# Patient Record
Sex: Male | Born: 1975 | Race: White | Hispanic: No | Marital: Married | Smoking: Former smoker
Health system: Southern US, Community
[De-identification: ages and names within clinical notes are randomized; demographics above are authoritative.]

## PROBLEM LIST (undated history)

## (undated) HISTORY — PX: BACK SURGERY: SHX140

## (undated) HISTORY — PX: APPENDECTOMY: SHX54

---

## 2016-12-26 DIAGNOSIS — K219 Gastro-esophageal reflux disease without esophagitis: Secondary | ICD-10-CM | POA: Diagnosis not present

## 2016-12-26 DIAGNOSIS — M25511 Pain in right shoulder: Secondary | ICD-10-CM | POA: Diagnosis not present

## 2016-12-26 MED FILL — raNITIdine HCL 300 MG TABS: 300 | 90 days supply | Qty: 90 | Fill #0

## 2016-12-26 MED FILL — VALACYCLOVIR HCL 500 MG TAB: 500 | 10 days supply | Qty: 20 | Fill #0

## 2017-03-22 MED FILL — VALACYCLOVIR HCL 500 MG TAB: 500 | 10 days supply | Qty: 20 | Fill #1

## 2017-07-04 MED FILL — hydrOXYzine PAMOATE 50 MG C: 50 | 10 days supply | Qty: 30 | Fill #0

## 2017-08-31 DIAGNOSIS — Z0000 Encounter for general adult medical examination without abnormal findings: Secondary | ICD-10-CM | POA: Diagnosis not present

## 2017-08-31 MED FILL — MONTELUKAST SOD 10 MG TAB: 10 | 30 days supply | Qty: 30 | Fill #0

## 2017-09-15 MED FILL — HYDROXYZINE PAM 50 MG CAP: 50 | 10 days supply | Qty: 30 | Fill #1

## 2017-09-22 DIAGNOSIS — K625 Hemorrhage of anus and rectum: Secondary | ICD-10-CM | POA: Diagnosis not present

## 2017-11-14 DIAGNOSIS — D122 Benign neoplasm of ascending colon: Secondary | ICD-10-CM | POA: Diagnosis not present

## 2017-11-14 DIAGNOSIS — K625 Hemorrhage of anus and rectum: Secondary | ICD-10-CM | POA: Diagnosis not present

## 2018-09-01 ENCOUNTER — Encounter (HOSPITAL_COMMUNITY): Payer: Self-pay

## 2018-09-01 ENCOUNTER — Ambulatory Visit (INDEPENDENT_AMBULATORY_CARE_PROVIDER_SITE_OTHER): Payer: 59

## 2018-09-01 ENCOUNTER — Ambulatory Visit (HOSPITAL_COMMUNITY)
Admission: EM | Admit: 2018-09-01 | Discharge: 2018-09-01 | Disposition: A | Discharge status: Home / Self Care | Payer: 59 | Attending: Emergency Medicine | Admitting: Emergency Medicine

## 2018-09-01 ENCOUNTER — Other Ambulatory Visit: Payer: Self-pay

## 2018-09-01 DIAGNOSIS — M79671 Pain in right foot: Secondary | ICD-10-CM | POA: Diagnosis not present

## 2018-09-01 DIAGNOSIS — S99921A Unspecified injury of right foot, initial encounter: Secondary | ICD-10-CM

## 2018-09-01 NOTE — ED Provider Notes (Signed)
[place]    CSN: [id number] Arrival date & time: 09/01/18  1111     History   Chief Complaint Chief Complaint  Patient presents with  . Foot Pain    HPI Raymond Blanchard is a 43 y.o. male.   Raymond Blanchard presents with complaints of dorsal aspect of right foot pain after a twist and fall injury 1 week ago. He was hunting and stepped wrong, his foot rolled, popped, and he fell down. Originally with ankle pain which has improved. Still with foot pain. It is sharp. Worse with weight bearing but does experience throbbing even while at rest. Wearing shoes feels better than without while ambulating. No numbness or tingling. No bruising or redness. Minimal swelling. Tried ibuprofen which minimally helped with pain. Denies any previous foot or ankle injury. Without contributing medical history.      ROS per HPI, negative if not otherwise mentioned.      History reviewed. No pertinent past medical history.  There are no active problems to display for this patient.   Past Surgical History:  Procedure Laterality Date  . APPENDECTOMY    . BACK SURGERY         Home Medications    Prior to Admission medications   Not on File    Family History Family History  Family history unknown: Yes    Social History Social History   Tobacco Use  . Smoking status: Former Smoker  Substance Use Topics  . Alcohol use: Not on file  . Drug use: Not on file     Allergies   Patient has no known allergies.   Review of Systems Review of Systems   Physical Exam Triage Vital Signs ED Triage Vitals [09/01/18 1126]  Enc Vitals Group     BP 119/86     Pulse Rate 88     Resp 18     Temp 98.3 F (36.8 C)     Temp Source Oral     SpO2 97 %     Weight      Height      Head Circumference      Peak Flow      Pain Score 7     Pain Loc      Pain Edu?      Excl. in [place]?    No data found.  Updated Vital Signs BP 119/86 (BP Location: Right Arm)   Pulse 88    Temp 98.3 F (36.8 C) (Oral)   Resp 18   SpO2 97%   Visual Acuity Right Eye Distance:   Left Eye Distance:   Bilateral Distance:    Right Eye Near:   Left Eye Near:    Bilateral Near:     Physical Exam Constitutional:      Appearance: He is well-developed.  Cardiovascular:     Rate and Rhythm: Normal rate and regular rhythm.  Pulmonary:     Effort: Pulmonary effort is normal.     Breath sounds: Normal breath sounds.  Musculoskeletal:     Right ankle: Normal.     Right foot: Normal capillary refill. Tenderness and bony tenderness present. No swelling, crepitus, deformity or laceration.       Feet:     Comments: Point tenderness to right dorsal foot, at approximately the proximal aspect of 4th metatarsal; no bruising or swelling; no redness; strong pedal pulse; cap refill < 2 seconds; gross sensation intact; ankle WNL  Skin:    General:  Skin is warm and dry.  Neurological:     Mental Status: He is alert and oriented to person, place, and time.      UC Treatments / Results  Labs (all labs ordered are listed, but only abnormal results are displayed) Labs Reviewed - No data to display  EKG None  Radiology Dg Foot Complete Right  Result Date: 09/01/2018 CLINICAL DATA:  Fall with twisting injury to the right foot 1 week prior with persistent pain EXAM: RIGHT FOOT COMPLETE - 3+ VIEW COMPARISON:  None. FINDINGS: There is no evidence of fracture or dislocation. There is no evidence of arthropathy or other focal bone abnormality. Soft tissues are unremarkable. IMPRESSION: No fracture or dislocation in the right foot. Electronically Signed   By: Ilona Sorrel M.D.   On: 09/01/2018 12:20    Procedures Procedures (including critical care time)  Medications Ordered in UC Medications - No data to display  Initial Impression / Assessment and Plan / UC Course  I have reviewed the triage vital signs and the nursing notes.  Pertinent labs & imaging results that were available  during my care of the patient were reviewed by me and considered in my medical decision making (see chart for details).     No redness, swelling or bruising. Xray without acute findings. Supportive cares provided, likely strain to foot. Solid soled shoe recommended. Follow up with ortho as needed. Patient verbalized understanding and agreeable to plan.  Ambulatory out of clinic without difficulty.   Final Clinical Impressions(s) / UC Diagnoses   Final diagnoses:  Injury of right foot, initial encounter     Discharge Instructions     EXAM: RIGHT FOOT COMPLETE - 3+ VIEW   COMPARISON:  None.   FINDINGS: There is no evidence of fracture or dislocation. There is no evidence of arthropathy or other focal bone abnormality. Soft tissues are unremarkable.   IMPRESSION: No fracture or dislocation in the right foot.     Electronically Signed   By: Ilona Sorrel M.D.   On: 09/01/2018 12:20   Ice, elevation, use of antiinflammatories as needed for pain.  Activity as tolerated, solid soled shoes for support, may even be helpful while at home.  Follow up with podiatry and/or orthopedics if no improvement in the next 3-4 weeks.     ED Prescriptions    None     Controlled Substance Prescriptions [place] Controlled Substance Registry consulted? Not Applicable   Zigmund Gottron, NP 09/01/18 1238

## 2018-09-01 NOTE — ED Triage Notes (Signed)
Pt presents with right foot injury from rolling foot while hunting a week ago.

## 2018-09-01 NOTE — Discharge Instructions (Signed)
EXAM: RIGHT FOOT COMPLETE - 3+ VIEW   COMPARISON:  None.   FINDINGS: There is no evidence of fracture or dislocation. There is no evidence of arthropathy or other focal bone abnormality. Soft tissues are unremarkable.   IMPRESSION: No fracture or dislocation in the right foot.     Electronically Signed   By: Ilona Sorrel M.D.   On: 09/01/2018 12:20   Ice, elevation, use of antiinflammatories as needed for pain.  Activity as tolerated, solid soled shoes for support, may even be helpful while at home.  Follow up with podiatry and/or orthopedics if no improvement in the next 3-4 weeks.

## 2020-04-06 DIAGNOSIS — Z136 Encounter for screening for cardiovascular disorders: Secondary | ICD-10-CM | POA: Diagnosis not present

## 2020-04-06 DIAGNOSIS — Z23 Encounter for immunization: Secondary | ICD-10-CM | POA: Diagnosis not present

## 2020-04-06 DIAGNOSIS — I493 Ventricular premature depolarization: Secondary | ICD-10-CM | POA: Diagnosis not present

## 2020-04-06 DIAGNOSIS — Z0000 Encounter for general adult medical examination without abnormal findings: Secondary | ICD-10-CM | POA: Diagnosis not present

## 2020-04-06 DIAGNOSIS — B351 Tinea unguium: Secondary | ICD-10-CM | POA: Diagnosis not present

## 2020-04-06 DIAGNOSIS — B009 Herpesviral infection, unspecified: Secondary | ICD-10-CM | POA: Diagnosis not present

## 2020-04-06 DIAGNOSIS — F5104 Psychophysiologic insomnia: Secondary | ICD-10-CM | POA: Diagnosis not present

## 2020-04-07 DIAGNOSIS — I493 Ventricular premature depolarization: Secondary | ICD-10-CM | POA: Diagnosis not present

## 2020-04-28 DIAGNOSIS — I493 Ventricular premature depolarization: Secondary | ICD-10-CM | POA: Diagnosis not present

## 2020-05-06 ENCOUNTER — Other Ambulatory Visit: Payer: Self-pay

## 2020-05-06 ENCOUNTER — Ambulatory Visit: Payer: 59 | Admitting: Cardiology

## 2020-05-06 ENCOUNTER — Encounter: Payer: Self-pay | Admitting: Cardiology

## 2020-05-06 VITALS — BP 132/79 | HR 94 | Resp 16 | Ht 75.0 in | Wt 226.4 lb

## 2020-05-06 DIAGNOSIS — Z87891 Personal history of nicotine dependence: Secondary | ICD-10-CM

## 2020-05-06 DIAGNOSIS — I493 Ventricular premature depolarization: Secondary | ICD-10-CM | POA: Diagnosis not present

## 2020-05-06 DIAGNOSIS — E785 Hyperlipidemia, unspecified: Secondary | ICD-10-CM

## 2020-05-06 NOTE — Progress Notes (Signed)
Primary Physician/Referring:  System, Provider Not In  Patient ID: Raymond Blanchard, male    DOB: 10-20-1975, 45 y.o.   MRN: [id number]  Chief Complaint  Patient presents with  . Abnormal ECG   HPI:    Raymond Blanchard  is a 45 y.o. with no significant prior cardiovascular history, who has history of tobacco use in the past, overall 15-pack-year history, referred to me for evaluation of PVCs.  Diagnosis of PVCs was made after his wife was leaning on his chest recognized frequent skipped beat.  He is referred to me by Ms. Joellyn Quails, NP for evaluation of abnormal EKG.  Patient is asymptomatic and does have an active lifestyle with regard to hunting, working on the yard with no chest pain or dyspnea.  There is no family history of premature coronary artery disease.  History reviewed. No pertinent past medical history. Past Surgical History:  Procedure Laterality Date  . APPENDECTOMY    . BACK SURGERY     Family History  Problem Relation Age of Onset  . Thyroid disease Mother   . Prostatitis Father     Social History   Tobacco Use  . Smoking status: Former Smoker    Types: Cigarettes  . Smokeless tobacco: Never Used  Substance Use Topics  . Alcohol use: Yes    Alcohol/week: 1.0 standard drink    Types: 1 Cans of beer per week    Comment: occasionally   Marital Status: Married  ROS  Review of Systems  Cardiovascular: Negative for chest pain, dyspnea on exertion and leg swelling.  Gastrointestinal: Negative for melena.   Objective  Blood pressure 132/79, pulse 94, resp. rate 16, height _0  (1.905 m), weight 226 lb 6.4 oz (102.7 kg), SpO2 97 %.  Vitals with BMI 05/06/2020 09/01/2018  Height _1  -  Weight 226 lbs 6 oz -  BMI 33.8 -  Systolic 250 539  Diastolic 79 86  Pulse 94 88     Physical Exam Cardiovascular:     Rate and Rhythm: Normal rate and regular rhythm.     Pulses: Intact distal pulses.     Heart sounds: Normal heart sounds. No murmur heard. No  gallop.      Comments: No leg edema, no JVD. Pulmonary:     Effort: Pulmonary effort is normal.     Breath sounds: Normal breath sounds.  Abdominal:     General: Bowel sounds are normal.     Palpations: Abdomen is soft.    Laboratory examination:   No results for input(s): NA, K, CL, CO2, GLUCOSE, BUN, CREATININE, CALCIUM, GFRNONAA, GFRAA in the last 8760 hours. CrCl cannot be calculated (No successful lab value found.).  No flowsheet data found. No flowsheet data found.  Lipid Panel No results for input(s): CHOL, TRIG, LDLCALC, VLDL, HDL, CHOLHDL, LDLDIRECT in the last 8760 hours.  HEMOGLOBIN A1C No results found for: HGBA1C, MPG TSH No results for input(s): TSH in the last 8760 hours.  External labs:   04/06/2020:  TSH 1.9, normal.  Hb 14.0/HCT 43.6, platelets 162, normal indicis.  Serum glucose 86 mg, BUN 13, creatinine 1.12, EGFR >60 mL.  Magnesium 1.95.  CMP normal.  Total cholesterol 180, triglycerides 87, HDL 44, LDL 134.  Medications and allergies  No Known Allergies   Outpatient Medications Prior to Visit  Medication Sig Dispense Refill  . cetirizine (ZYRTEC) 10 MG tablet Take by mouth.    . traZODone (DESYREL) 100 MG tablet Take 100 mg  by mouth at bedtime as needed.    . valACYclovir (VALTREX) 500 MG tablet Take 500 mg by mouth 2 (two) times daily.     No facility-administered medications prior to visit.    Radiology:   No results found.  Cardiac Studies:   None EKG:     EKG 05/06/2020: Normal sinus rhythm at the rate of 97 bpm, borderline criteria for left atrial enlargement, normal axis.  Incomplete right bundle branch block.  No evidence of ischemia.   EKG 04/06/2020: Normal sinus rhythm at rate of 78 bpm, normal axis, unifocal PVCs (2).  Assessment     ICD-10-CM   1. PVC (premature ventricular contraction)  I49.3 EKG 12-Lead    CT CARDIAC SCORING (SELF PAY ONLY)  2. History of tobacco use  Z87.891   3. Mild hyperlipidemia  E78.5       There are no discontinued medications.  No orders of the defined types were placed in this encounter.  Orders Placed This Encounter  Procedures  . CT CARDIAC SCORING (SELF PAY ONLY)    Standing Status:   Future    Standing Expiration Date:   07/04/2020    Order Specific Question:   Preferred imaging location?    Answer:   [facility]    Order Specific Question:   Radiology Contrast Protocol - do NOT remove file path    Answer:   [email or web address]  . EKG 12-Lead    Recommendations:   Raymond Blanchard is a 45 y.o. with no significant prior cardiovascular history, who has history of tobacco use in the past, overall 15-pack-year history, referred to me for evaluation of PVCs.  Diagnosis of PVCs was made after his wife was leaning on his chest recognized frequent skipped beat.  His wife is a Designer, jewellery in cardiology.  I reviewed the EKG and his labs, TSH is normal, lipids are mildly elevated.  His only risk factor is 15-pack-year history of smoking which he has quit about 2 years ago.    PVCs may be incidental but may suggest significant underlying cardiac issues as well.  Patient has worn Zio patch and results of which are pending still and I will follow up on this.    I will set him up for a coronary calcium score and depending upon this we will further restratify him from cardiac standpoint either by routine treadmill stress test or by nuclear stress test.  His lipids are mildly elevated and mildly abnormal although this can be brought to normal by lifestyle modification, along with lifestyle modification depending upon coronary calcium score I may consider initiation of statin therapy.  At this point I did not make any changes to his medications and will wait for coronary calcium score.  Patient was reassured.  I have made an appointment to see me back in 3 months but will be in touch with him may sooner than that.  My personal contact  number given to the patient as his wife is a cardiology nurse practitioner and she can certainly reach out to me as well.    Adrian Prows, MD, [facility] 05/06/2020, 2:16 PM Office: [phone]

## 2020-05-22 ENCOUNTER — Ambulatory Visit (HOSPITAL_COMMUNITY)
Admission: RE | Admit: 2020-05-22 | Discharge: 2020-05-22 | Disposition: A | Discharge status: Home / Self Care | Payer: Self-pay | Source: Ambulatory Visit | Attending: Cardiology | Admitting: Cardiology

## 2020-05-22 ENCOUNTER — Other Ambulatory Visit: Payer: Self-pay

## 2020-05-22 DIAGNOSIS — I493 Ventricular premature depolarization: Secondary | ICD-10-CM | POA: Insufficient documentation

## 2020-05-27 DIAGNOSIS — I493 Ventricular premature depolarization: Secondary | ICD-10-CM | POA: Insufficient documentation

## 2020-05-31 DIAGNOSIS — U071 COVID-19: Secondary | ICD-10-CM | POA: Diagnosis not present

## 2020-05-31 DIAGNOSIS — B349 Viral infection, unspecified: Secondary | ICD-10-CM | POA: Diagnosis not present

## 2020-06-15 ENCOUNTER — Telehealth: Payer: Self-pay

## 2020-06-15 NOTE — Telephone Encounter (Signed)
Please call patient to reschedule appointment for 6 months from last appointment. Patient is aware and will wait on your call.

## 2020-06-16 DIAGNOSIS — H40013 Open angle with borderline findings, low risk, bilateral: Secondary | ICD-10-CM | POA: Diagnosis not present

## 2020-06-18 NOTE — Telephone Encounter (Signed)
R/s to 11/05/2020

## 2020-08-06 ENCOUNTER — Ambulatory Visit: Payer: 59 | Admitting: Cardiology

## 2020-09-17 DIAGNOSIS — B351 Tinea unguium: Secondary | ICD-10-CM | POA: Diagnosis not present

## 2020-09-17 DIAGNOSIS — F5101 Primary insomnia: Secondary | ICD-10-CM | POA: Diagnosis not present

## 2020-10-02 DIAGNOSIS — B351 Tinea unguium: Secondary | ICD-10-CM | POA: Diagnosis not present

## 2020-10-02 DIAGNOSIS — F5101 Primary insomnia: Secondary | ICD-10-CM | POA: Diagnosis not present

## 2020-11-05 ENCOUNTER — Ambulatory Visit: Payer: 59 | Admitting: Cardiology

## 2022-07-05 IMAGING — CT CT CARDIAC CORONARY ARTERY CALCIUM SCORE
1 of 4 series · 9 of 20 positions shown, 12 images · non-contrast
Comparison: None.
COMPARISON: None.

Addendum:
EXAM:
OVER-READ INTERPRETATION  CT CHEST

The following report is an over-read performed by radiologist Dr.
over-read does not include interpretation of cardiac or coronary
anatomy or pathology. The coronary calcium interpretation by the
cardiologist is attached.
CLINICAL DATA: Risk stratification, CAD eval, asymptomatic, family
hx PVC, H/O Tobacco use and mild hyperlipemia
Coronary Calcium Score
TECHNIQUE: The patient was scanned on a Siemens Force scanner. Axial
non-contrast 3 mm slices were carried out through the heart. The
data set was analyzed on a dedicated work station and scored using
the Agatson method.

[Series 6: ax thins · axial · 0.70mm/px · z∈[-137,-9]mm · 9 of 230 slices shown, 12 images]
[im 23/230  vessel]
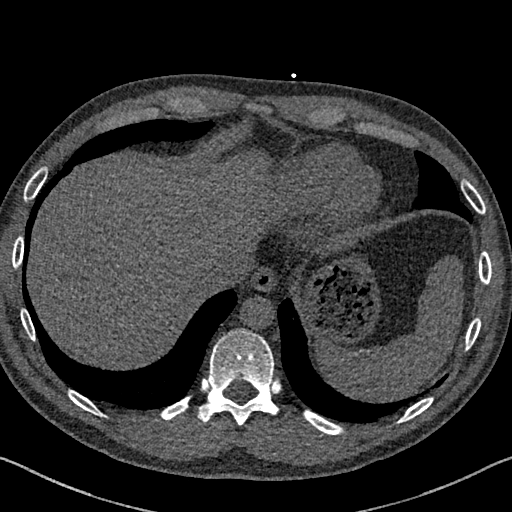
[im 23/230  lung]
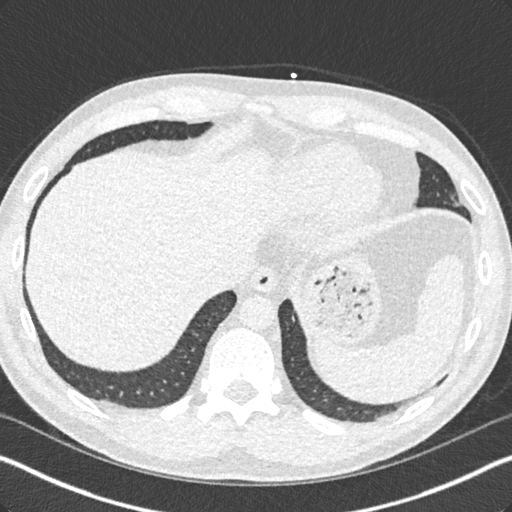
[im 46/230  vessel]
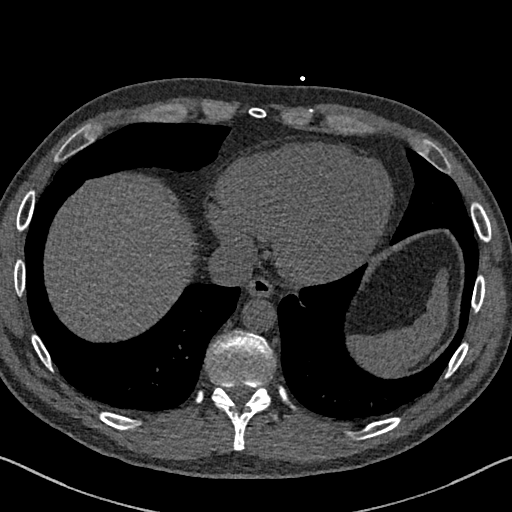
[im 69/230  vessel]
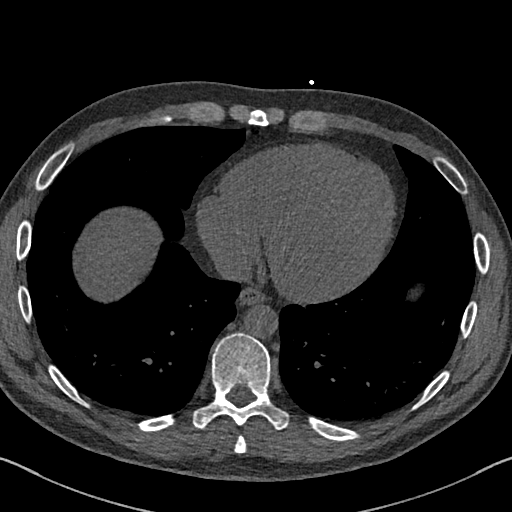
[im 92/230  vessel]
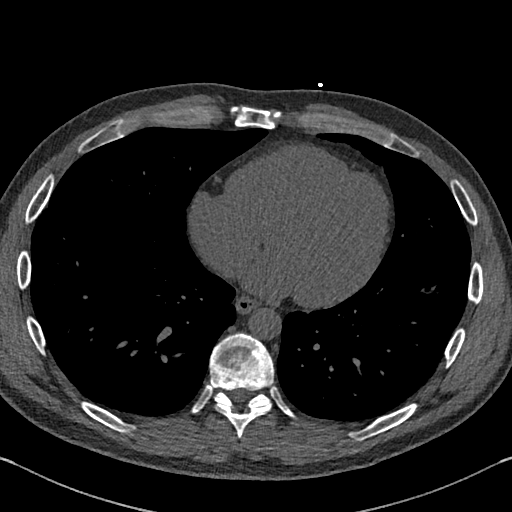
[im 115/230  vessel]
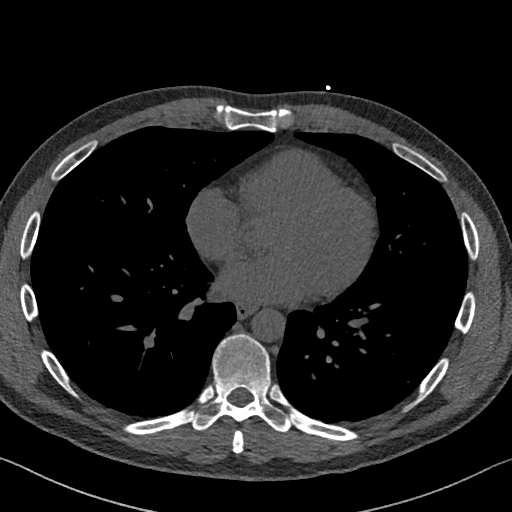
[im 115/230  lung]
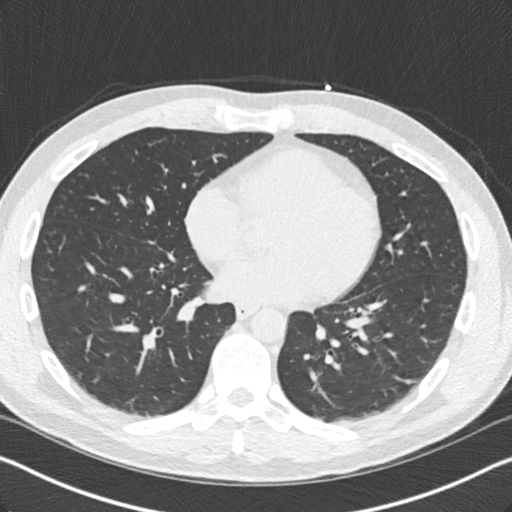
[im 138/230  vessel]
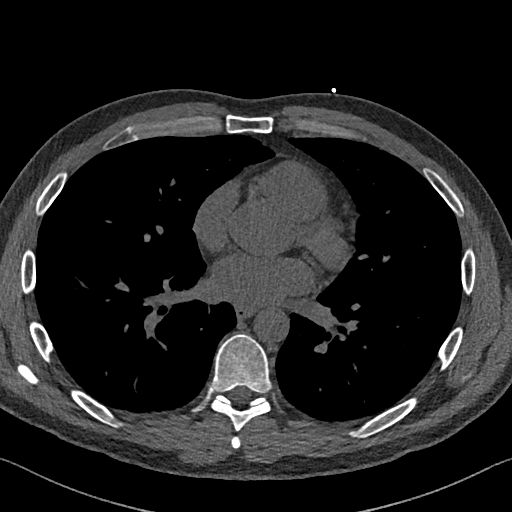
[im 161/230  vessel]
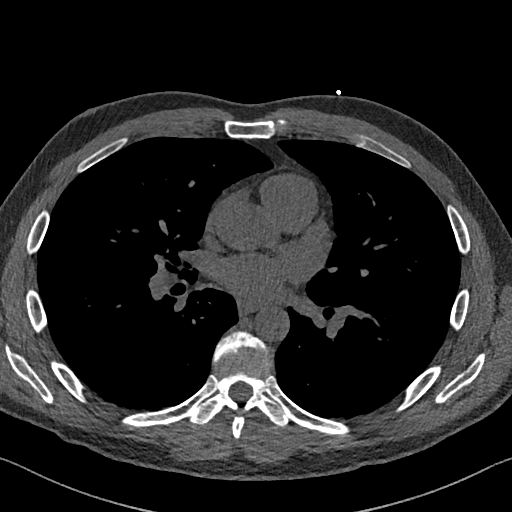
[im 184/230  vessel]
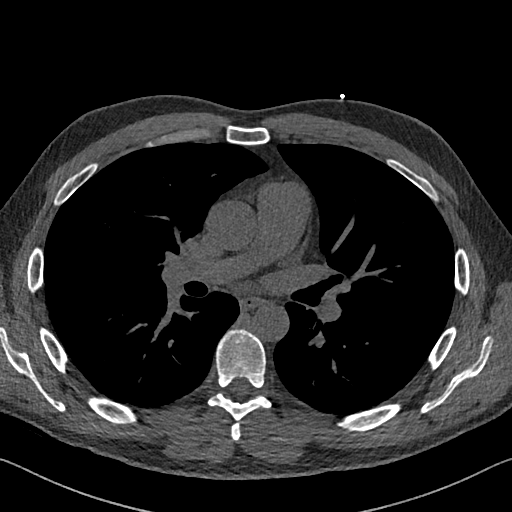
[im 207/230  vessel]
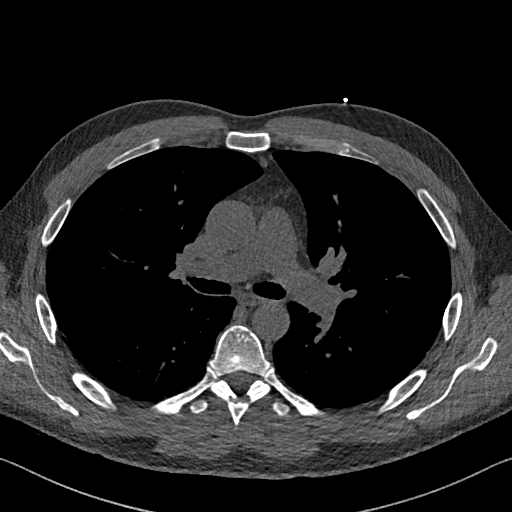
[im 207/230  lung]
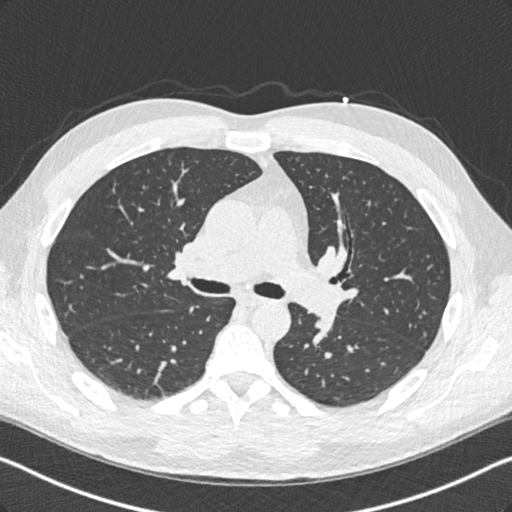

[9 of 20 positions shown; findings below may reference images not displayed]

FINDINGS: Limited view of the lung parenchyma demonstrates no suspicious
nodularity. Airways are normal.

Limited view of the mediastinum demonstrates no adenopathy.
Esophagus normal.

Limited view of the upper abdomen unremarkable.

Limited view of the skeleton and chest wall is unremarkable.
IMPRESSION: No significant extracardiac findings.
FINDINGS: Non-cardiac: See separate report from [REDACTED].

Ascending Aorta: 35 mm at the mid ascending aorta measured in an
axial plane.

Pericardium: Normal

Coronary arteries:

Coronary calcium score of 0. This was 0 percentile for age and sex
matched control.
IMPRESSION: Coronary calcium score of 0.

*** End of Addendum ***
EXAM:
OVER-READ INTERPRETATION  CT CHEST

The following report is an over-read performed by radiologist Dr.
over-read does not include interpretation of cardiac or coronary
anatomy or pathology. The coronary calcium interpretation by the
cardiologist is attached.
FINDINGS: Limited view of the lung parenchyma demonstrates no suspicious
nodularity. Airways are normal.

Limited view of the mediastinum demonstrates no adenopathy.
Esophagus normal.

Limited view of the upper abdomen unremarkable.

Limited view of the skeleton and chest wall is unremarkable.
IMPRESSION: No significant extracardiac findings.
